# Patient Record
Sex: Female | Born: 1985 | Race: Black or African American | Hispanic: No | Marital: Single | Smoking: Current every day smoker
Health system: Southern US, Community
[De-identification: ages and names within clinical notes are randomized; demographics above are authoritative.]

---

## 2018-04-23 ENCOUNTER — Ambulatory Visit
Admission: EM | Admit: 2018-04-23 | Discharge: 2018-04-23 | Disposition: A | Discharge status: Home / Self Care | Payer: Self-pay | Attending: Family Medicine | Admitting: Family Medicine

## 2018-04-23 ENCOUNTER — Ambulatory Visit (INDEPENDENT_AMBULATORY_CARE_PROVIDER_SITE_OTHER): Payer: Self-pay

## 2018-04-23 DIAGNOSIS — M79671 Pain in right foot: Secondary | ICD-10-CM

## 2018-04-23 DIAGNOSIS — M21611 Bunion of right foot: Secondary | ICD-10-CM

## 2018-04-23 DIAGNOSIS — R2241 Localized swelling, mass and lump, right lower limb: Secondary | ICD-10-CM

## 2018-04-23 MED ORDER — NAPROXEN 500 MG PO TABS: 500.0000 mg | ORAL_TABLET | Freq: Two times a day (BID) | ORAL | 0 refills | Status: AC

## 2018-04-23 NOTE — ED Provider Notes (Signed)
[facility]    CSN: [id number] Arrival date & time: 04/23/18  0827     History   Chief Complaint Chief Complaint  Patient presents with  . Foot Pain    HPI Mackenzie Pratt is a 32 y.o. female.   HPI  32 year old female presents with right foot pain localized over the first MTP that she had for a month.  Is increasingly worse.  It is now is very painful to bear weight . 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        History reviewed. No pertinent past medical history.  There are no active problems to display for this patient.   History reviewed. No pertinent surgical history.  OB History   None      Home Medications    Prior to Admission medications   Medication Sig Start Date End Date Taking? Authorizing Provider  naproxen (NAPROSYN) 500 MG tablet Take 1 tablet (500 mg total) by mouth 2 (two) times daily with a meal. 04/23/18   Lutricia Feil, PA-C    Family History No family history on file.  Social History Social History   Tobacco Use  . Smoking status: Current Every Day Smoker  . Smokeless tobacco: Never Used  Substance Use Topics  . Alcohol use: Yes  . Drug use: Not on file     Allergies   Bee venom   Review of Systems Review of Systems  Constitutional: Positive for activity change. Negative for appetite change, chills, fatigue and fever.  Musculoskeletal: Positive for arthralgias.  All other systems reviewed and are negative.    Physical Exam Triage Vital Signs ED Triage Vitals  Enc Vitals Group     BP 04/23/18 0838 119/84     Pulse Rate 04/23/18 0838 95     Resp 04/23/18 0838 18     Temp 04/23/18 0838 98.9 F (37.2 C)     Temp Source 04/23/18 0838 Oral     SpO2 04/23/18 0838 100 %     Weight 04/23/18 0840 210 lb (95.3 kg)     Height --      Head Circumference --      Peak Flow --      Pain Score 04/23/18 0839 10     Pain Loc --      Pain Edu? --      Excl. in GC? --    No data  found.  Updated Vital Signs BP 119/84 (BP Location: Left Arm)   Pulse 95   Temp 98.9 F (37.2 C) (Oral)   Resp 18   Wt 210 lb (95.3 kg)   LMP 04/02/2018 (Exact Date) Comment: denies preg  SpO2 100%   Visual Acuity Right Eye Distance:   Left Eye Distance:   Bilateral Distance:    Right Eye Near:   Left Eye Near:    Bilateral Near:     Physical Exam  Constitutional: She is oriented to person, place, and time. She appears well-developed and well-nourished. No distress.  HENT:  Head: Normocephalic.  Eyes: Pupils are equal, round, and reactive to light. Right eye exhibits no discharge. Left eye exhibits no discharge.  Neck: Normal range of motion.  Musculoskeletal: Normal range of motion. She exhibits tenderness.  Examination of the right first MTP joint shows a medial swelling that is tender.  There is no in duration or puncture wounds is present.  There is mild halgus valgus present.  There is edema  and erythema present over the lateral portion of the first MTP  Neurological: She is alert and oriented to person, place, and time.  Skin: Skin is warm and dry. She is not diaphoretic.  Psychiatric: She has a normal mood and affect. Her behavior is normal. Judgment and thought content normal.  Nursing note and vitals reviewed.    UC Treatments / Results  Labs (all labs ordered are listed, but only abnormal results are displayed) Labs Reviewed - No data to display  EKG None  Radiology Dg Foot Complete Right  Result Date: 04/23/2018 CLINICAL DATA:  First MTP pain, no known injury, initial encounter EXAM: RIGHT FOOT COMPLETE - 3+ VIEW COMPARISON:  None. FINDINGS: No acute fracture or dislocation is noted. Mild soft tissue swelling is noted about the first MTP joint. No acute bony abnormality is seen. No other soft tissue abnormality is noted. IMPRESSION: Mild soft tissue swelling without acute bony abnormality. Electronically Signed   By: Alcide CleverMark  Lukens M.D.   On: 04/23/2018 09:12     Procedures Procedures (including critical care time)  Medications Ordered in UC Medications - No data to display  Initial Impression / Assessment and Plan / UC Course  I have reviewed the triage vital signs and the nursing notes.  Pertinent labs & imaging results that were available during my care of the patient were reviewed by me and considered in my medical decision making (see chart for details).     Plan: 1. Test/x-ray results and diagnosis reviewed with patient 2. rx as per orders; risks, benefits, potential side effects reviewed with patient 3. Recommend supportive treatment with as necessary to control swelling and pain.  Wear wide toe box shoes.  Purchase a bunion cushion use at work.  Will up with podiatry if the pain continues to worsen interferes with your life. 4. F/u prn if symptoms worsen or don't improve  Final Clinical Impressions(s) / UC Diagnoses   Final diagnoses:  Bunion of great toe of right foot   Discharge Instructions   None    ED Prescriptions    Medication Sig Dispense Auth. Provider   naproxen (NAPROSYN) 500 MG tablet Take 1 tablet (500 mg total) by mouth 2 (two) times daily with a meal. 60 tablet Lutricia Feiloemer, Ruffus Kamaka P, PA-C     Controlled Substance Prescriptions [place] Controlled Substance Registry consulted? Not Applicable   Lutricia FeilRoemer, Avrian Delfavero P, PA-C 04/23/18 0930

## 2018-04-23 NOTE — ED Triage Notes (Signed)
Pt is here for right foot pain since 1 month but has increasingly gotten worse. Hurts to bear weight, feels like the pain is coming from her "joint" hasn't tried any otc medications.

## 2019-12-10 IMAGING — CR DG FOOT COMPLETE 3+V*R*
3 series · 3 of 3 positions shown · non-contrast
Comparison: None.

CLINICAL DATA: First MTP pain, no known injury, initial encounter

EXAM:
RIGHT FOOT COMPLETE - 3+ VIEW

[foot ap]
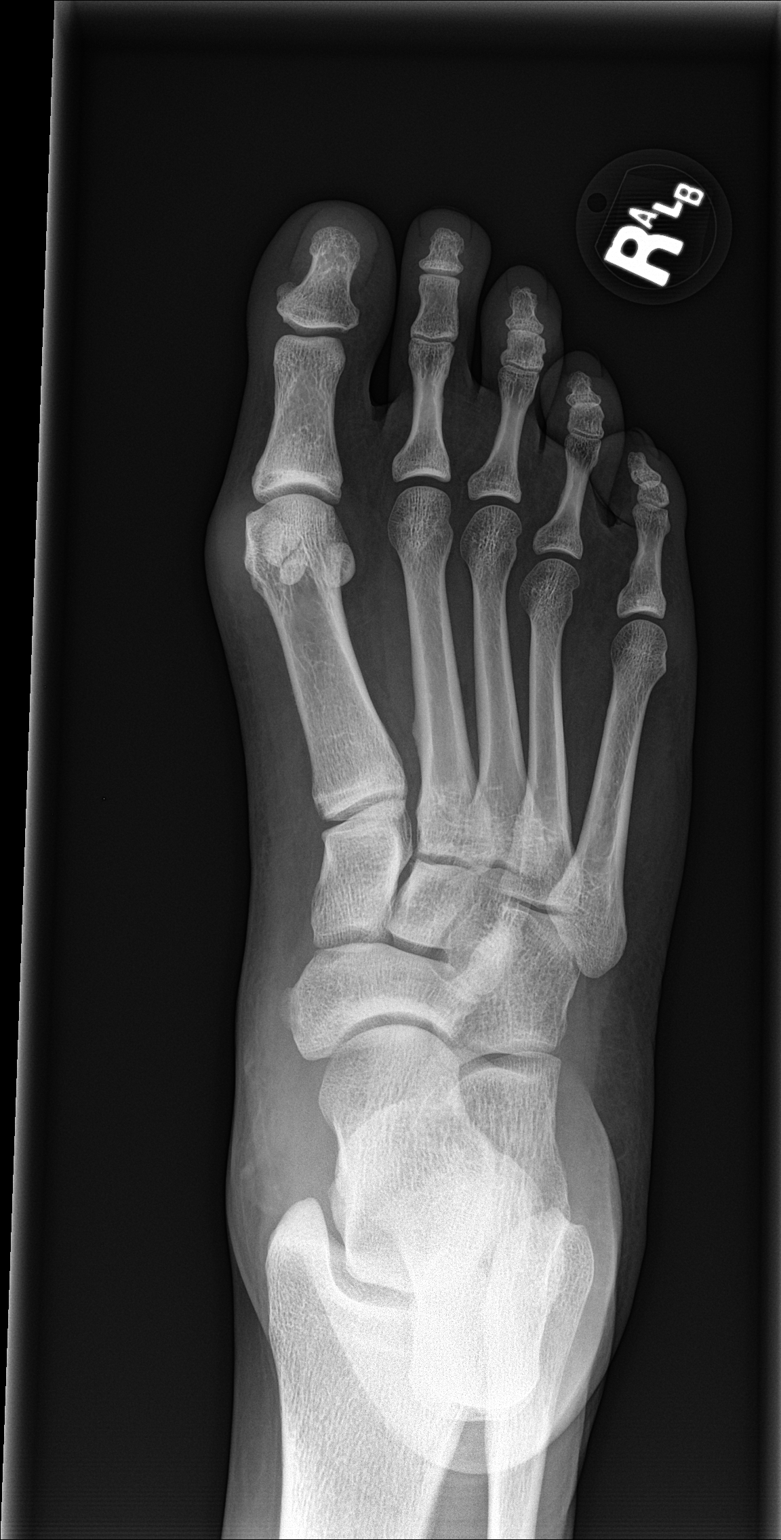

[foot obl]
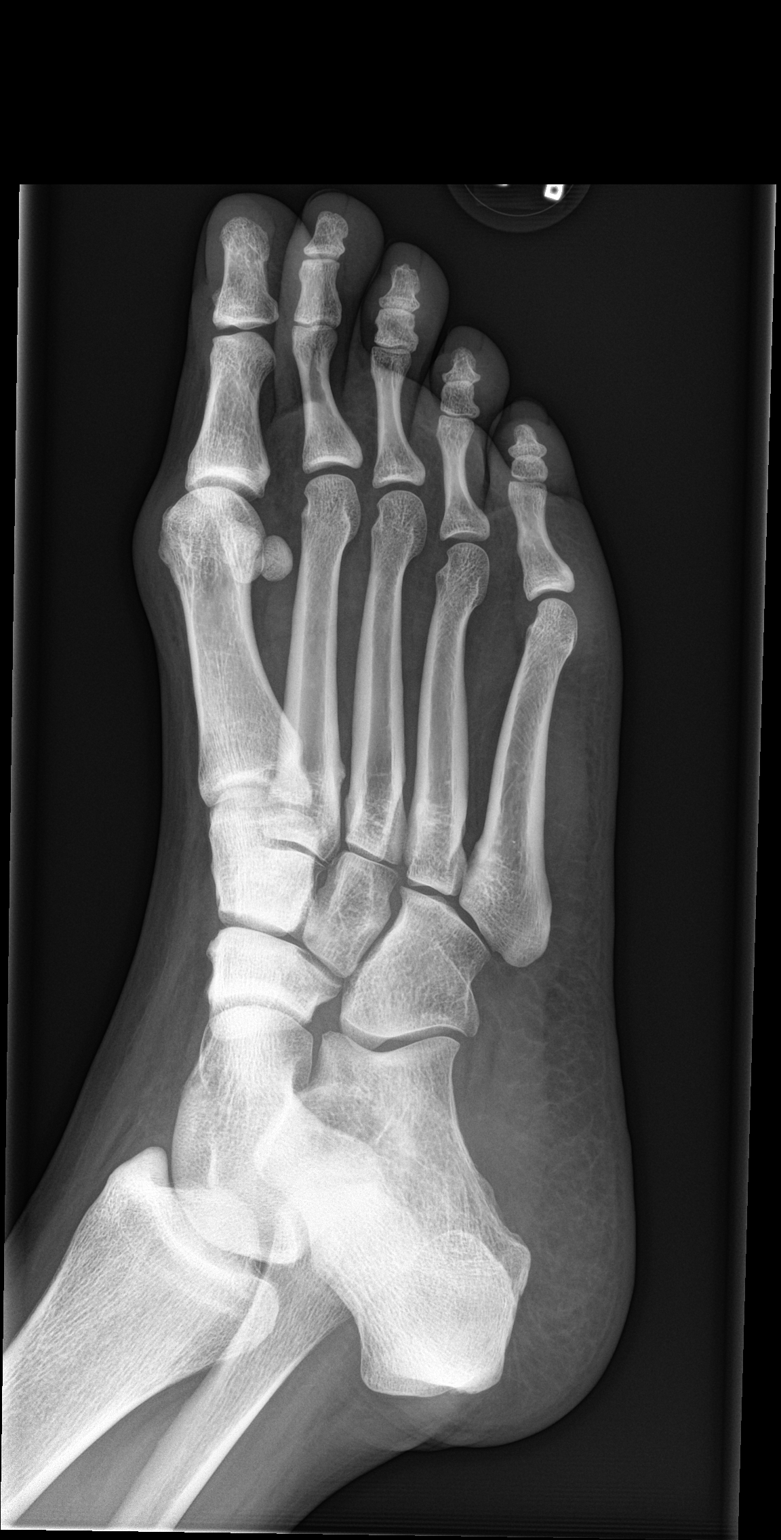

[foot lat]
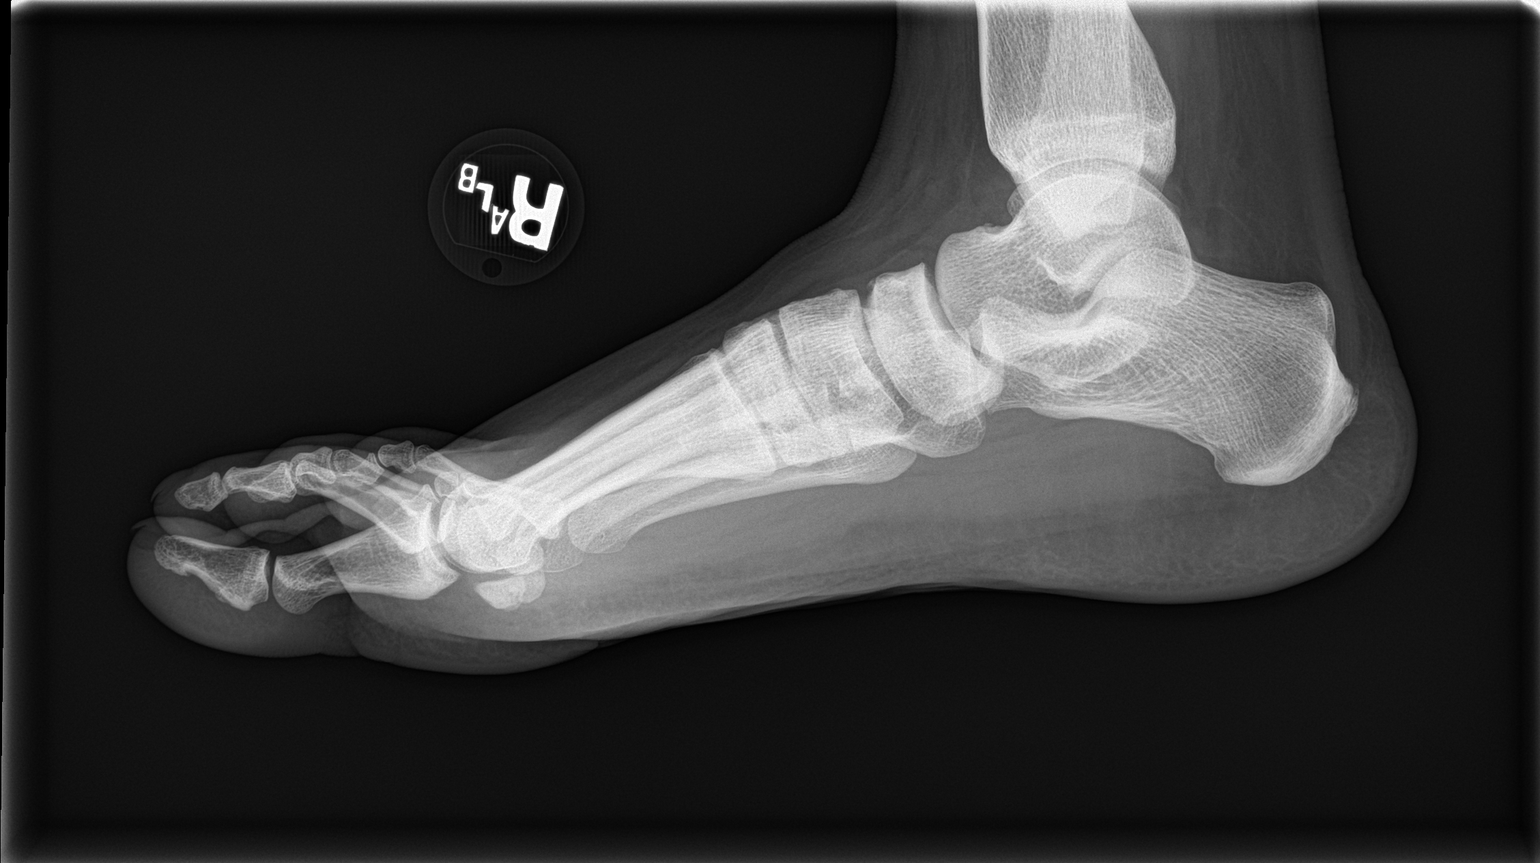

[3 of 3 positions shown; findings below may reference images not displayed]

FINDINGS: No acute fracture or dislocation is noted. Mild soft tissue swelling
is noted about the first MTP joint. No acute bony abnormality is
seen. No other soft tissue abnormality is noted.
IMPRESSION: Mild soft tissue swelling without acute bony abnormality.
